# Patient Record
Sex: Female | Born: 1935 | Race: Black or African American | Hispanic: No | Marital: Single | Smoking: Never smoker
Health system: Southern US, Community
[De-identification: ages and names within clinical notes are randomized; demographics above are authoritative.]

## PROBLEM LIST (undated history)

## (undated) DIAGNOSIS — F329 Major depressive disorder, single episode, unspecified: Secondary | ICD-10-CM

## (undated) DIAGNOSIS — F32A Depression, unspecified: Secondary | ICD-10-CM

## (undated) DIAGNOSIS — K219 Gastro-esophageal reflux disease without esophagitis: Secondary | ICD-10-CM

## (undated) DIAGNOSIS — N390 Urinary tract infection, site not specified: Secondary | ICD-10-CM

---

## 2010-11-28 ENCOUNTER — Emergency Department: Payer: Self-pay | Admitting: *Deleted

## 2013-07-31 ENCOUNTER — Encounter (HOSPITAL_COMMUNITY): Payer: Self-pay | Admitting: Emergency Medicine

## 2013-07-31 ENCOUNTER — Emergency Department (HOSPITAL_COMMUNITY)
Admission: EM | Admit: 2013-07-31 | Discharge: 2013-08-01 | Disposition: A | Discharge status: Home / Self Care | Payer: Medicare Other | Attending: Emergency Medicine | Admitting: Emergency Medicine

## 2013-07-31 ENCOUNTER — Emergency Department (HOSPITAL_COMMUNITY): Payer: Medicare Other

## 2013-07-31 DIAGNOSIS — Z79899 Other long term (current) drug therapy: Secondary | ICD-10-CM | POA: Insufficient documentation

## 2013-07-31 DIAGNOSIS — N390 Urinary tract infection, site not specified: Secondary | ICD-10-CM | POA: Diagnosis not present

## 2013-07-31 DIAGNOSIS — Z792 Long term (current) use of antibiotics: Secondary | ICD-10-CM | POA: Diagnosis not present

## 2013-07-31 DIAGNOSIS — K219 Gastro-esophageal reflux disease without esophagitis: Secondary | ICD-10-CM | POA: Insufficient documentation

## 2013-07-31 DIAGNOSIS — F329 Major depressive disorder, single episode, unspecified: Secondary | ICD-10-CM | POA: Diagnosis not present

## 2013-07-31 DIAGNOSIS — R002 Palpitations: Secondary | ICD-10-CM | POA: Diagnosis not present

## 2013-07-31 DIAGNOSIS — F0390 Unspecified dementia without behavioral disturbance: Secondary | ICD-10-CM | POA: Insufficient documentation

## 2013-07-31 DIAGNOSIS — R079 Chest pain, unspecified: Secondary | ICD-10-CM | POA: Diagnosis present

## 2013-07-31 DIAGNOSIS — F3289 Other specified depressive episodes: Secondary | ICD-10-CM | POA: Diagnosis not present

## 2013-07-31 DIAGNOSIS — IMO0002 Reserved for concepts with insufficient information to code with codable children: Secondary | ICD-10-CM | POA: Insufficient documentation

## 2013-07-31 HISTORY — DX: Depression, unspecified: F32.A

## 2013-07-31 HISTORY — DX: Urinary tract infection, site not specified: N39.0

## 2013-07-31 HISTORY — DX: Gastro-esophageal reflux disease without esophagitis: K21.9

## 2013-07-31 HISTORY — DX: Major depressive disorder, single episode, unspecified: F32.9

## 2013-07-31 LAB — COMPREHENSIVE METABOLIC PANEL
ALT: 7 U/L (ref 0–35)
AST: 18 U/L (ref 0–37)
Albumin: 3.6 g/dL (ref 3.5–5.2)
Alkaline Phosphatase: 71 U/L (ref 39–117)
Anion gap: 10 (ref 5–15)
BUN: 33 mg/dL — ABNORMAL HIGH (ref 6–23)
CALCIUM: 9.9 mg/dL (ref 8.4–10.5)
CO2: 26 meq/L (ref 19–32)
CREATININE: 1.68 mg/dL — AB (ref 0.50–1.10)
Chloride: 108 mEq/L (ref 96–112)
GFR, EST AFRICAN AMERICAN: 31 mL/min — AB (ref >90)
GFR, EST NON AFRICAN AMERICAN: 27 mL/min — AB (ref >90)
GLUCOSE: 103 mg/dL — AB (ref 70–99)
Potassium: 4.6 mEq/L (ref 3.7–5.3)
Sodium: 144 mEq/L (ref 137–147)
Total Bilirubin: 0.2 mg/dL — ABNORMAL LOW (ref 0.3–1.2)
Total Protein: 6.6 g/dL (ref 6.0–8.3)

## 2013-07-31 LAB — CBC WITH DIFFERENTIAL/PLATELET
BASOS ABS: 0 10*3/uL (ref 0.0–0.1)
Basophils Relative: 0 % (ref 0–1)
EOS ABS: 0.2 10*3/uL (ref 0.0–0.7)
EOS PCT: 2 % (ref 0–5)
HEMATOCRIT: 36.3 % (ref 36.0–46.0)
Hemoglobin: 11.9 g/dL — ABNORMAL LOW (ref 12.0–15.0)
LYMPHS PCT: 20 % (ref 12–46)
Lymphs Abs: 1.9 10*3/uL (ref 0.7–4.0)
MCH: 28.6 pg (ref 26.0–34.0)
MCHC: 32.8 g/dL (ref 30.0–36.0)
MCV: 87.3 fL (ref 78.0–100.0)
MONO ABS: 0.5 10*3/uL (ref 0.1–1.0)
Monocytes Relative: 5 % (ref 3–12)
Neutro Abs: 7 10*3/uL (ref 1.7–7.7)
Neutrophils Relative %: 73 % (ref 43–77)
Platelets: 199 10*3/uL (ref 150–400)
RBC: 4.16 MIL/uL (ref 3.87–5.11)
RDW: 12.8 % (ref 11.5–15.5)
WBC: 9.5 10*3/uL (ref 4.0–10.5)

## 2013-07-31 LAB — URINALYSIS, ROUTINE W REFLEX MICROSCOPIC
Bilirubin Urine: NEGATIVE
Glucose, UA: NEGATIVE mg/dL
Hgb urine dipstick: NEGATIVE
NITRITE: NEGATIVE
PROTEIN: NEGATIVE mg/dL
Specific Gravity, Urine: 1.02 (ref 1.005–1.030)
Urobilinogen, UA: 0.2 mg/dL (ref 0.0–1.0)
pH: 6 (ref 5.0–8.0)

## 2013-07-31 LAB — URINE MICROSCOPIC-ADD ON

## 2013-07-31 LAB — LIPASE, BLOOD: LIPASE: 42 U/L (ref 11–59)

## 2013-07-31 LAB — TROPONIN I: Troponin I: <0.3 ng/mL (ref <0.30)

## 2013-07-31 NOTE — ED Notes (Signed)
Pt c/o chest pain at nursing facility. The facility gave pt maalox and tylenol. Pt denies pain at this time.

## 2013-07-31 NOTE — ED Provider Notes (Signed)
CSN: [id number]     Arrival date & time 07/31/13  2004 History  This chart was scribed for Shon Baton, MD by Modena Jansky, ED Scribe. This patient was seen in room APA14/APA14 and the patient's care was started at 8:28 PM.   Chief Complaint  Patient presents with  . Chest Pain   Level 5 Caveat HPI HPI Comments: Maevis Mumby is a 80 y.o. female who presents to the Emergency Department complaining of intermittent chest pain that started today. Nursing facility staff member states that she came up to the front desk saying that her "chest was going to pop out". Pt states that at the time she felt like she was going to pass out. Pt is able to somewhat recall event. She states that her foot hurts. Staff member denies any nausea, emesis, or fever in pt. Level 5 Caveat due to dementia.   Past Medical History  Diagnosis Date  . GERD (gastroesophageal reflux disease)   . Depression   . UTI (lower urinary tract infection)    History reviewed. No pertinent past surgical history. History reviewed. No pertinent family history. History  Substance Use Topics  . Smoking status: Never Smoker   . Smokeless tobacco: Not on file  . Alcohol Use: No   OB History   Grav Para Term Preterm Abortions TAB SAB Ect Mult Living                 Review of Systems  Unable to perform ROS: Dementia    Allergies  Review of patient's allergies indicates no known allergies.  Home Medications   Prior to Admission medications   Medication Sig Start Date End Date Taking? Authorizing Provider  acetaminophen (TYLENOL) 325 MG tablet Take 650 mg by mouth every 6 (six) hours as needed for mild pain or moderate pain.   Yes Historical Provider, MD  benzonatate (TESSALON) 200 MG capsule Take 200 mg by mouth 3 (three) times daily.   Yes Historical Provider, MD  benztropine (COGENTIN) 0.5 MG tablet Take 0.5 mg by mouth 2 (two) times daily.   Yes Historical Provider, MD  bismuth subsalicylate (PEPTO BISMOL) 262  MG/15ML suspension Take 30 mLs by mouth every 6 (six) hours as needed for indigestion.   Yes Historical Provider, MD  carbidopa-levodopa (SINEMET IR) 10-100 MG per tablet Take 1 tablet by mouth 3 (three) times daily.   Yes Historical Provider, MD  divalproex (DEPAKOTE SPRINKLE) 125 MG capsule Take 125 mg by mouth 2 (two) times daily.   Yes Historical Provider, MD  fludrocortisone (FLORINEF) 0.1 MG tablet Take 0.1 mg by mouth daily.   Yes Historical Provider, MD  loratadine (CLARITIN) 10 MG tablet Take 10 mg by mouth daily.   Yes Historical Provider, MD  meloxicam (MOBIC) 7.5 MG tablet Take 7.5 mg by mouth 2 (two) times daily.   Yes Historical Provider, MD  Memantine HCl ER (NAMENDA XR) 28 MG CP24 Take 1 capsule by mouth every morning.   Yes Historical Provider, MD  mirtazapine (REMERON) 15 MG tablet Take 15 mg by mouth at bedtime.   Yes Historical Provider, MD  Multiple Vitamin (MULTIVITAMIN WITH MINERALS) TABS tablet Take 1 tablet by mouth daily.   Yes Historical Provider, MD  Olopatadine HCl (PATADAY) 0.2 % SOLN Apply 1 drop to eye at bedtime.   Yes Historical Provider, MD  omeprazole (PRILOSEC) 20 MG capsule Take 20 mg by mouth daily.   Yes Historical Provider, MD  polyethylene glycol powder (GLYCOLAX/MIRALAX) powder Take 17  g by mouth daily.   Yes Historical Provider, MD  risperiDONE (RISPERDAL) 0.25 MG tablet Take 0.25 mg by mouth 2 (two) times daily.   Yes Historical Provider, MD  traZODone (DESYREL) 50 MG tablet Take 50 mg by mouth at bedtime.   Yes Historical Provider, MD  cephALEXin (KEFLEX) 500 MG capsule Take 1 capsule (500 mg total) by mouth 2 (two) times daily. 08/01/13   Shon Batonourtney F Horton, MD   BP 125/54  Pulse 79  Temp(Src) 97.7 F (36.5 C) (Oral)  Resp 18  Ht 5\' 2"  (1.575 m)  Wt 140 lb (63.504 kg)  BMI 25.60 kg/m2  SpO2 96% Physical Exam  Nursing note and vitals reviewed. Constitutional: She appears well-developed and well-nourished.  Elderly, no acute distress  HENT:   Head: Normocephalic and atraumatic.  Mucous membranes dry  Neck: Neck supple.  Cardiovascular: Normal rate, regular rhythm and normal heart sounds.   No murmur heard. Pulmonary/Chest: Effort normal and breath sounds normal. No respiratory distress. She has no wheezes. She exhibits no tenderness.  Abdominal: Soft. Bowel sounds are normal. There is no tenderness. There is no rebound.  Musculoskeletal: She exhibits no edema.  Neurological: She is alert.  Follow simple commands  Skin: Skin is warm and dry.  Psychiatric: She has a normal mood and affect.    ED Course  Procedures (including critical care time) DIAGNOSTIC STUDIES: Oxygen Saturation is 96% on RA, normal by my interpretation.    COORDINATION OF CARE: 8:32 PM- Pt advised of plan for treatment and pt agrees.  Labs Review Labs Reviewed  CBC WITH DIFFERENTIAL - Abnormal; Notable for the following:    Hemoglobin 11.9 (*)    All other components within normal limits  COMPREHENSIVE METABOLIC PANEL - Abnormal; Notable for the following:    Glucose, Bld 103 (*)    BUN 33 (*)    Creatinine, Ser 1.68 (*)    Total Bilirubin 0.2 (*)    GFR calc non Af Amer 27 (*)    GFR calc Af Amer 31 (*)    All other components within normal limits  URINALYSIS, ROUTINE W REFLEX MICROSCOPIC - Abnormal; Notable for the following:    Ketones, ur TRACE (*)    Leukocytes, UA LARGE (*)    All other components within normal limits  URINE MICROSCOPIC-ADD ON - Abnormal; Notable for the following:    Squamous Epithelial / LPF FEW (*)    Bacteria, UA FEW (*)    All other components within normal limits  URINE CULTURE  TROPONIN I  LIPASE, BLOOD  TROPONIN I    Imaging Review Dg Abd Acute W/chest  07/31/2013   CLINICAL DATA:  Intermittent chest pain.  EXAM: ACUTE ABDOMEN SERIES (ABDOMEN 2 VIEW & CHEST 1 VIEW)  COMPARISON:  Chest radiograph from 11/28/2010  FINDINGS: The lungs are well-aerated. Mild bibasilar airspace opacities likely reflect  atelectasis. A stable nodular density in the left midlung zone may reflect a benign nodule. There is no evidence of pleural effusion or pneumothorax. The cardiomediastinal silhouette is within normal limits.  The visualized bowel gas pattern is unremarkable. Scattered stool and air are seen within the colon; there is no evidence of small bowel dilatation to suggest obstruction. No free intra-abdominal air is identified on the provided upright view. Clips are noted within the right upper quadrant, reflecting prior cholecystectomy.  No acute osseous abnormalities are seen; the sacroiliac joints are unremarkable in appearance.  IMPRESSION: 1. Unremarkable bowel gas pattern; no free intra-abdominal air seen.  Small to moderate amount of stool noted in the colon. 2. Mild bibasilar airspace opacities likely reflect atelectasis.   Electronically Signed   By: Roanna Raider M.D.   On: 07/31/2013 21:56     EKG Interpretation   Date/Time:  Monday July 31 2013 20:13:24 EDT Ventricular Rate:  77 PR Interval:  171 QRS Duration: 90 QT Interval:  384 QTC Calculation: 435 R Axis:   9 Text Interpretation:  Sinus rhythm Probable anteroseptal infarct, old no  prior for comparison Confirmed by HORTON  MD, COURTNEY ([id number]) on  07/31/2013 8:23:13 PM      MDM   Final diagnoses:  UTI (lower urinary tract infection)  Palpitations    Patient presents with reported complaints of chest pain.  Early she denies any pain. Patient's daughter is now present and states that the patient has a history of palpitations. No known history of heart disease. EKG is reassuring chest x-ray is benign. Basic workup including troponin. She does have evidence of UTI. Will culture. Patient given antibiotics and will be discharged on Keflex. Delta troponin is negative. She continues to have no complaints. Will discharge home. Discussed workup with daughter who stated understanding.  After history, exam, and medical workup I feel the  patient has been appropriately medically screened and is safe for discharge home. Pertinent diagnoses were discussed with the patient. Patient was given return precautions.   I personally performed the services described in this documentation, which was scribed in my presence. The recorded information has been reviewed and is accurate.     Shon Baton, MD 08/01/13 2049

## 2013-07-31 NOTE — ED Notes (Signed)
Pt ambulatory to restroom with stand by assist. Pt back to room, placed on monitor, family at bedside

## 2013-08-01 LAB — TROPONIN I: Troponin I: <0.3 ng/mL (ref <0.30)

## 2013-08-01 MED ORDER — CEPHALEXIN 500 MG PO CAPS: 500.0000 mg | ORAL_CAPSULE | Freq: Two times a day (BID) | ORAL | Status: AC

## 2013-08-01 NOTE — Discharge Instructions (Signed)
Palpitations A palpitation is the feeling that your heartbeat is irregular or is faster than normal. It may feel like your heart is fluttering or skipping a beat. Palpitations are usually not a serious problem. However, in some cases, you may need further medical evaluation. CAUSES  Palpitations can be caused by:  Smoking.  Caffeine or other stimulants, such as diet pills or energy drinks.  Alcohol.  Stress and anxiety.  Strenuous physical activity.  Fatigue.  Certain medicines.  Heart disease, especially if you have a history of irregular heart rhythms (arrhythmias), such as atrial fibrillation, atrial flutter, or supraventricular tachycardia.  An improperly working pacemaker or defibrillator. DIAGNOSIS  To find the cause of your palpitations, your health care provider will take your medical history and perform a physical exam. Your health care provider may also have you take a test called an ambulatory electrocardiogram (ECG). An ECG records your heartbeat patterns over a 24-hour period. You may also have other tests, such as:  Transthoracic echocardiogram (TTE). During echocardiography, sound waves are used to evaluate how blood flows through your heart.  Transesophageal echocardiogram (TEE).  Cardiac monitoring. This allows your health care provider to monitor your heart rate and rhythm in real time.  Holter monitor. This is a portable device that records your heartbeat and can help diagnose heart arrhythmias. It allows your health care provider to track your heart activity for several days, if needed.  Stress tests by exercise or by giving medicine that makes the heart beat faster. TREATMENT  Treatment of palpitations depends on the cause of your symptoms and can vary greatly. Most cases of palpitations do not require any treatment other than time, relaxation, and monitoring your symptoms. Other causes, such as atrial fibrillation, atrial flutter, or supraventricular  tachycardia, usually require further treatment. HOME CARE INSTRUCTIONS   Avoid:  Caffeinated coffee, tea, soft drinks, diet pills, and energy drinks.  Chocolate.  Alcohol.  Stop smoking if you smoke.  Reduce your stress and anxiety. Things that can help you relax include:  A method of controlling things in your body, such as your heartbeats, with your mind (biofeedback).  Yoga.  Meditation.  Physical activity such as swimming, jogging, or walking.  Get plenty of rest and sleep. SEEK MEDICAL CARE IF:   You continue to have a fast or irregular heartbeat beyond 24 hours.  Your palpitations occur more often. SEEK IMMEDIATE MEDICAL CARE IF:  You have chest pain or shortness of breath.  You have a severe headache.  You feel dizzy or you faint. MAKE SURE YOU:  Understand these instructions.  Will watch your condition.  Will get help right away if you are not doing well or get worse. Document Released: 12/27/1999 Document Revised: 01/03/2013 Document Reviewed: 02/27/2011 [facility] Patient Information 2015 [place], [place]. This information is not intended to replace advice given to you by your health care provider. Make sure you discuss any questions you have with your health care provider. Urinary Tract Infection Urinary tract infections (UTIs) can develop anywhere along your urinary tract. Your urinary tract is your body's drainage system for removing wastes and extra water. Your urinary tract includes two kidneys, two ureters, a bladder, and a urethra. Your kidneys are a pair of bean-shaped organs. Each kidney is about the size of your fist. They are located below your ribs, one on each side of your spine. CAUSES Infections are caused by microbes, which are microscopic organisms, including fungi, viruses, and bacteria. These organisms are so small that they can  a pair of bean-shaped organs. Each kidney is about the size of your fist. They are located below your ribs, one on each side of your spine.  CAUSES  Infections are caused by microbes, which are microscopic organisms, including fungi, viruses, and bacteria. These organisms are so small that they can only be seen through a microscope. Bacteria are the microbes that most commonly cause UTIs.  SYMPTOMS   Symptoms  of UTIs may vary by age and gender of the patient and by the location of the infection. Symptoms in young women typically include a frequent and intense urge to urinate and a painful, burning feeling in the bladder or urethra during urination. Older women and men are more likely to be tired, shaky, and weak and have muscle aches and abdominal pain. A fever may mean the infection is in your kidneys. Other symptoms of a kidney infection include pain in your back or sides below the ribs, nausea, and vomiting.  DIAGNOSIS  To diagnose a UTI, your caregiver will ask you about your symptoms. Your caregiver also will ask to provide a urine sample. The urine sample will be tested for bacteria and white blood cells. White blood cells are made by your body to help fight infection.  TREATMENT   Typically, UTIs can be treated with medication. Because most UTIs are caused by a bacterial infection, they usually can be treated with the use of antibiotics. The choice of antibiotic and length of treatment depend on your symptoms and the type of bacteria causing your infection.  HOME CARE INSTRUCTIONS   If you were prescribed antibiotics, take them exactly as your caregiver instructs you. Finish the medication even if you feel better after you have only taken some of the medication.   Drink enough water and fluids to keep your urine clear or pale yellow.   Avoid caffeine, tea, and carbonated beverages. They tend to irritate your bladder.   Empty your bladder often. Avoid holding urine for long periods of time.   Empty your bladder before and after sexual intercourse.   After a bowel movement, women should cleanse from front to back. Use each tissue only once.  SEEK MEDICAL CARE IF:    You have back pain.   You develop a fever.   Your symptoms do not begin to resolve within 3 days.  SEEK IMMEDIATE MEDICAL CARE IF:    You have severe back pain or lower abdominal pain.   You develop chills.   You have nausea or  vomiting.   You have continued burning or discomfort with urination.  MAKE SURE YOU:    Understand these instructions.   Will watch your condition.   Will get help right away if you are not doing well or get worse.  Document Released: 10/08/2004 Document Revised: 06/30/2011 Document Reviewed: 02/06/2011  [employer] Patient Information 2015 [employer]. This information is not intended to replace advice given to you by your health care provider. Make sure you discuss any questions you have with your health care provider.

## 2013-08-02 LAB — URINE CULTURE: Colony Count: >=100000

## 2015-03-04 IMAGING — CR DG ABDOMEN ACUTE W/ 1V CHEST
4 series · 4 of 4 positions shown · non-contrast
Comparison: Chest radiograph from 11/28/2010

CLINICAL DATA: Intermittent chest pain.

EXAM:
ACUTE ABDOMEN SERIES (ABDOMEN 2 VIEW & CHEST 1 VIEW)

[view not recorded (1 of 4)]
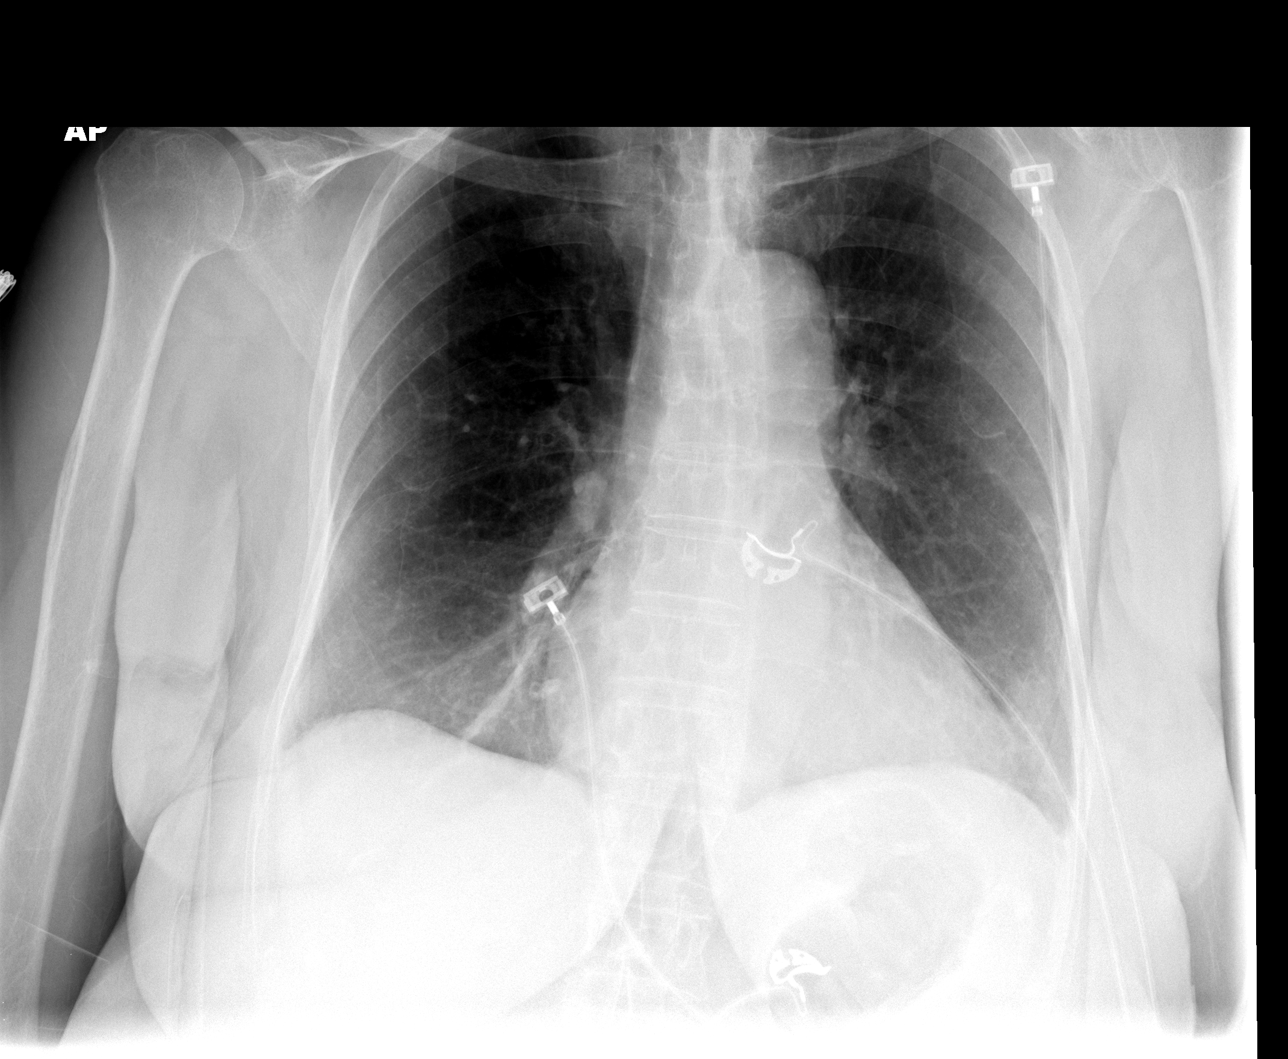

[view not recorded (2 of 4)]
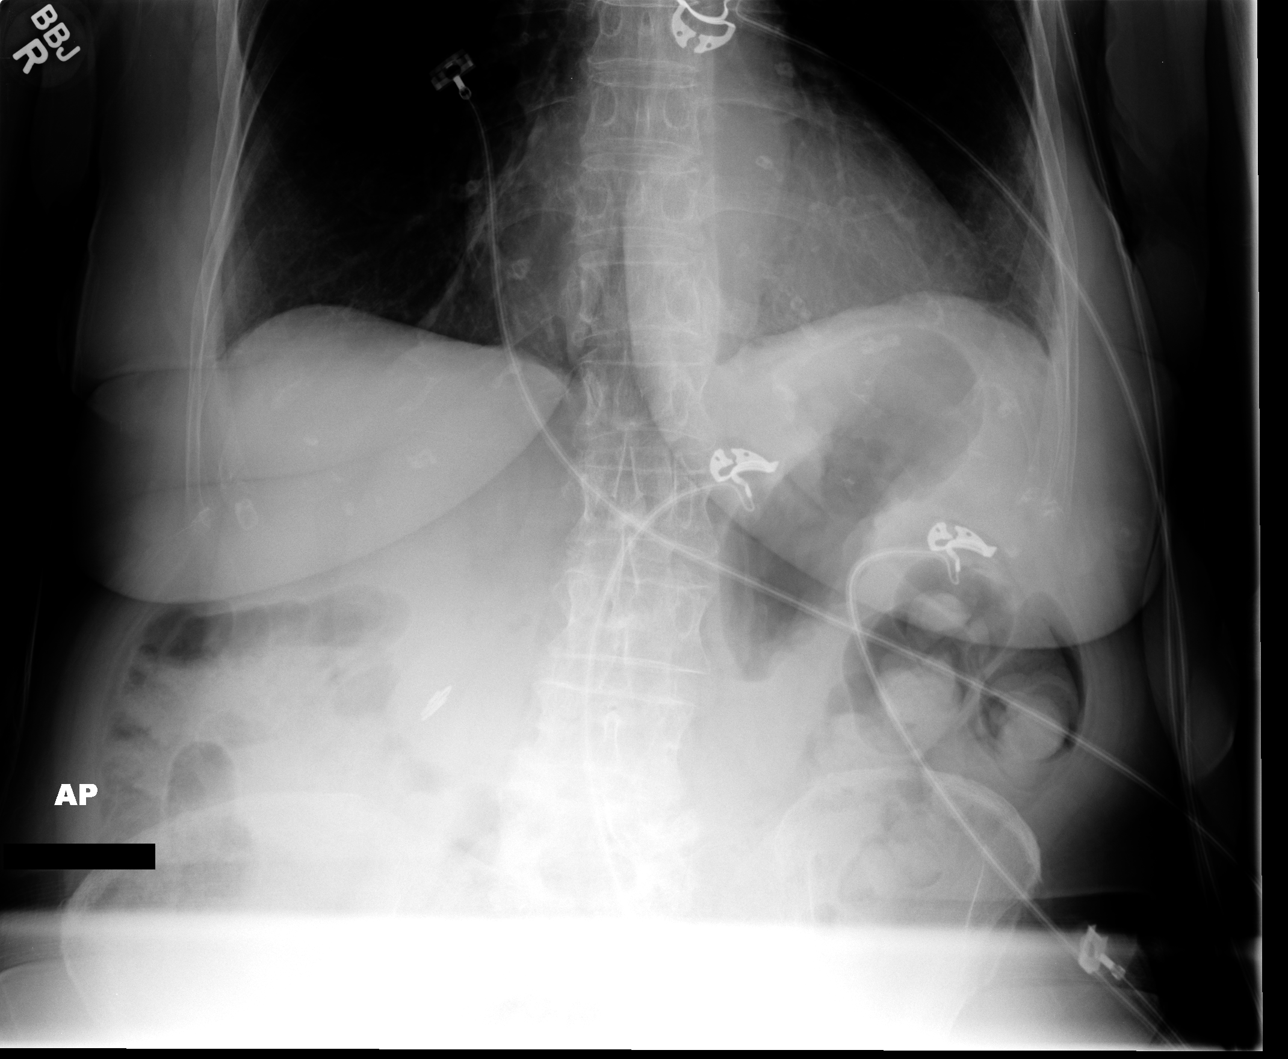

[view not recorded (3 of 4)]
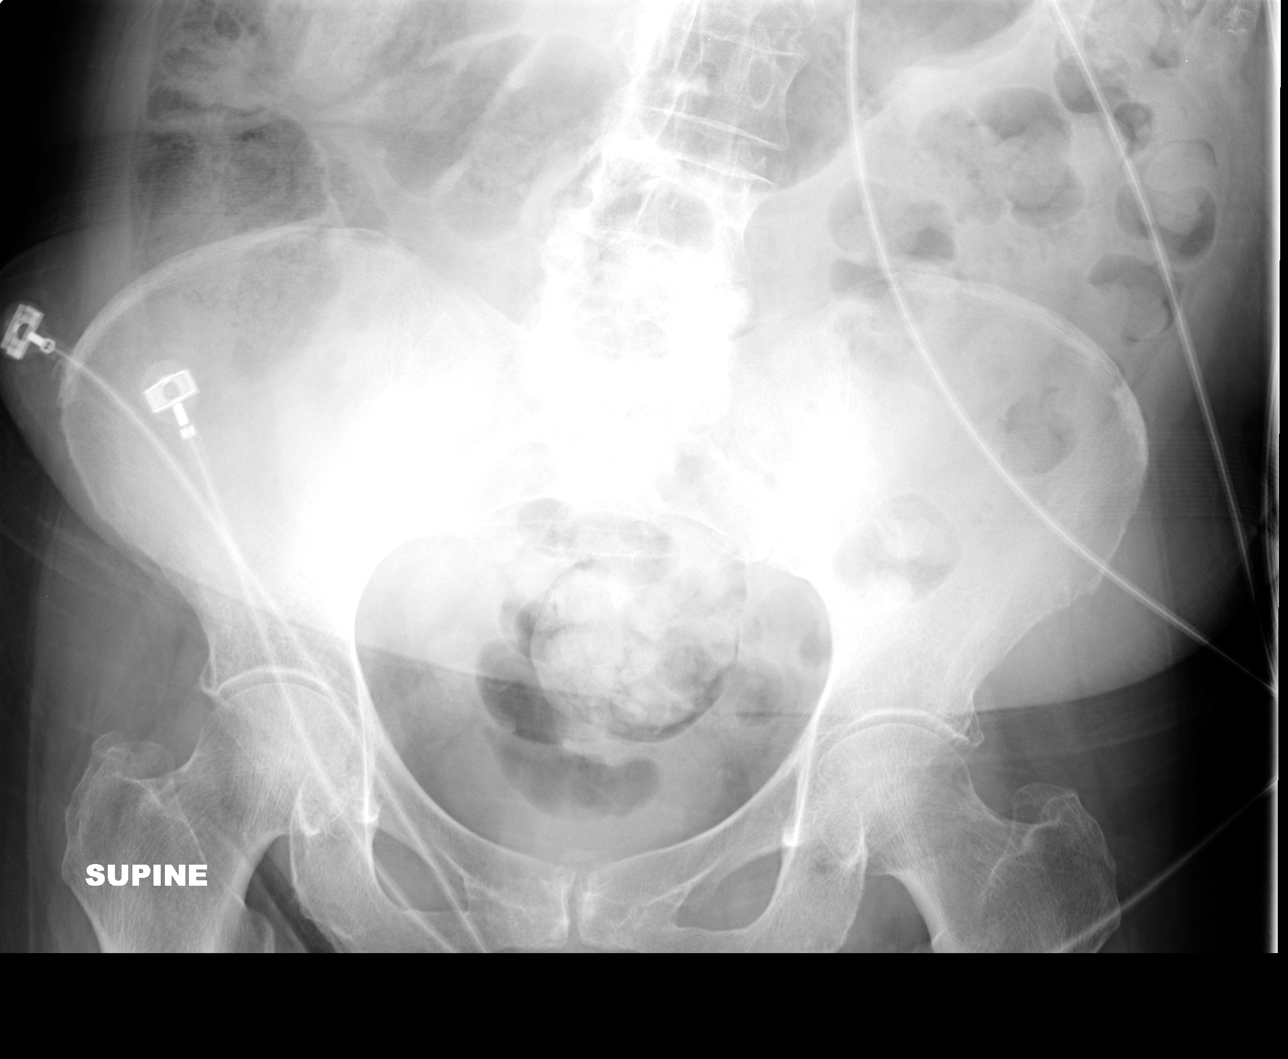

[view not recorded (4 of 4)]
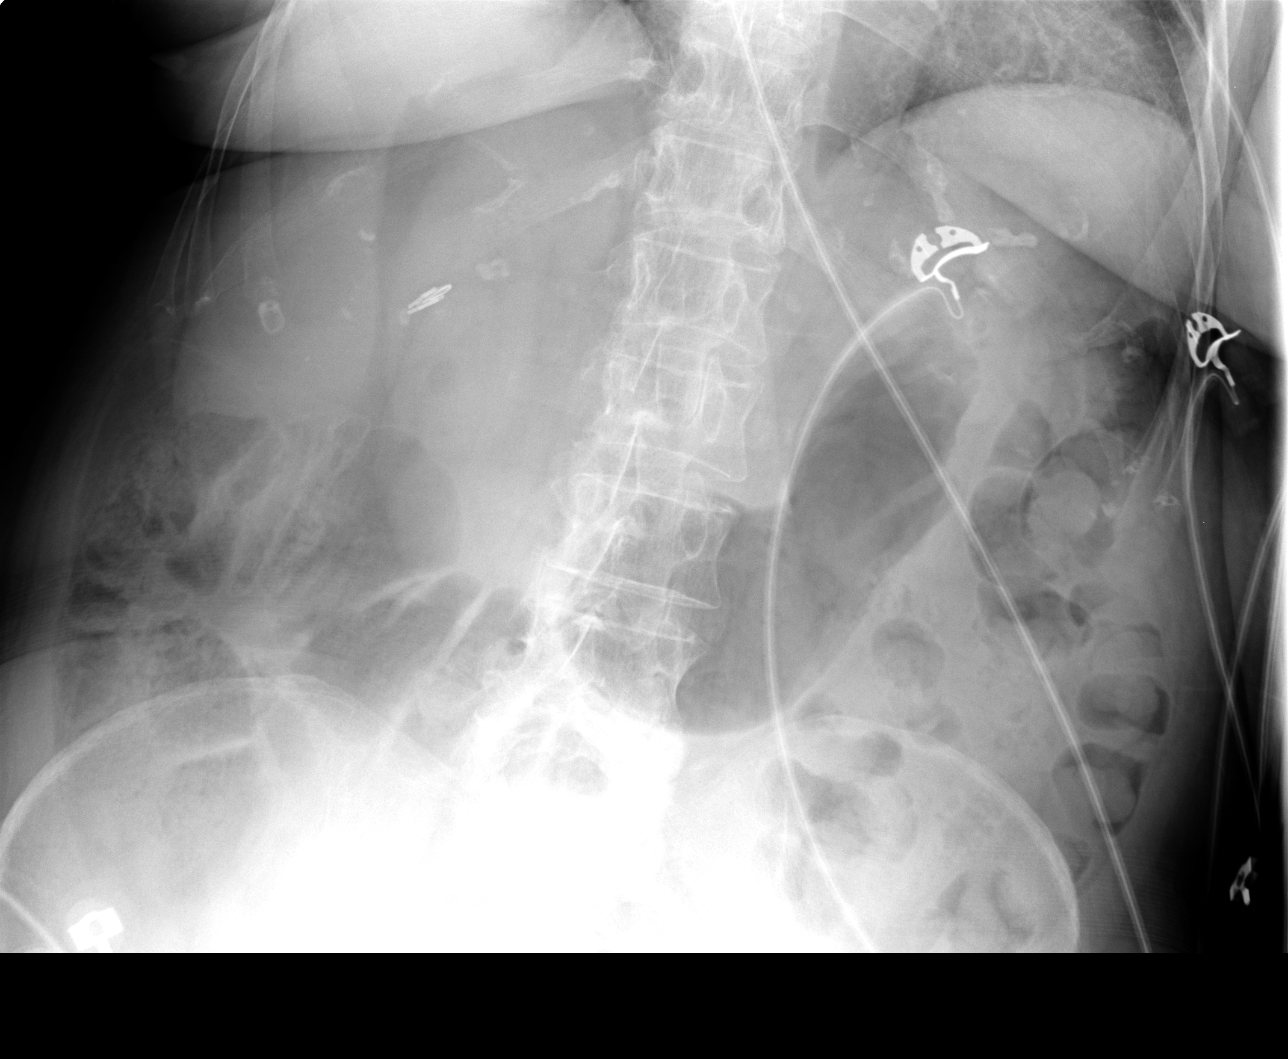

[4 of 4 positions shown; findings below may reference images not displayed]

FINDINGS: The lungs are well-aerated. Mild bibasilar airspace opacities likely
reflect atelectasis. A stable nodular density in the left midlung
zone may reflect a benign nodule. There is no evidence of pleural
effusion or pneumothorax. The cardiomediastinal silhouette is within
normal limits.

The visualized bowel gas pattern is unremarkable. Scattered stool
and air are seen within the colon; there is no evidence of small
bowel dilatation to suggest obstruction. No free intra-abdominal air
is identified on the provided upright view. Clips are noted within
the right upper quadrant, reflecting prior cholecystectomy.

No acute osseous abnormalities are seen; the sacroiliac joints are
unremarkable in appearance.
IMPRESSION: 1. Unremarkable bowel gas pattern; no free intra-abdominal air seen.
Small to moderate amount of stool noted in the colon.
2. Mild bibasilar airspace opacities likely reflect atelectasis.

## 2019-02-13 DEATH — deceased
